# Patient Record
Sex: Female | Born: 1995 | Hispanic: No | Marital: Single | Smoking: Never smoker
Health system: Southern US, Community
[De-identification: ages and names within clinical notes are randomized; demographics above are authoritative.]

---
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
## 2017-04-07 IMAGING — DX DG CHEST 2V
2 series · 2 of 2 positions shown · non-contrast
Comparison: None.

CLINICAL DATA: Fall

EXAM:
CHEST  2 VIEW

[chest pa]
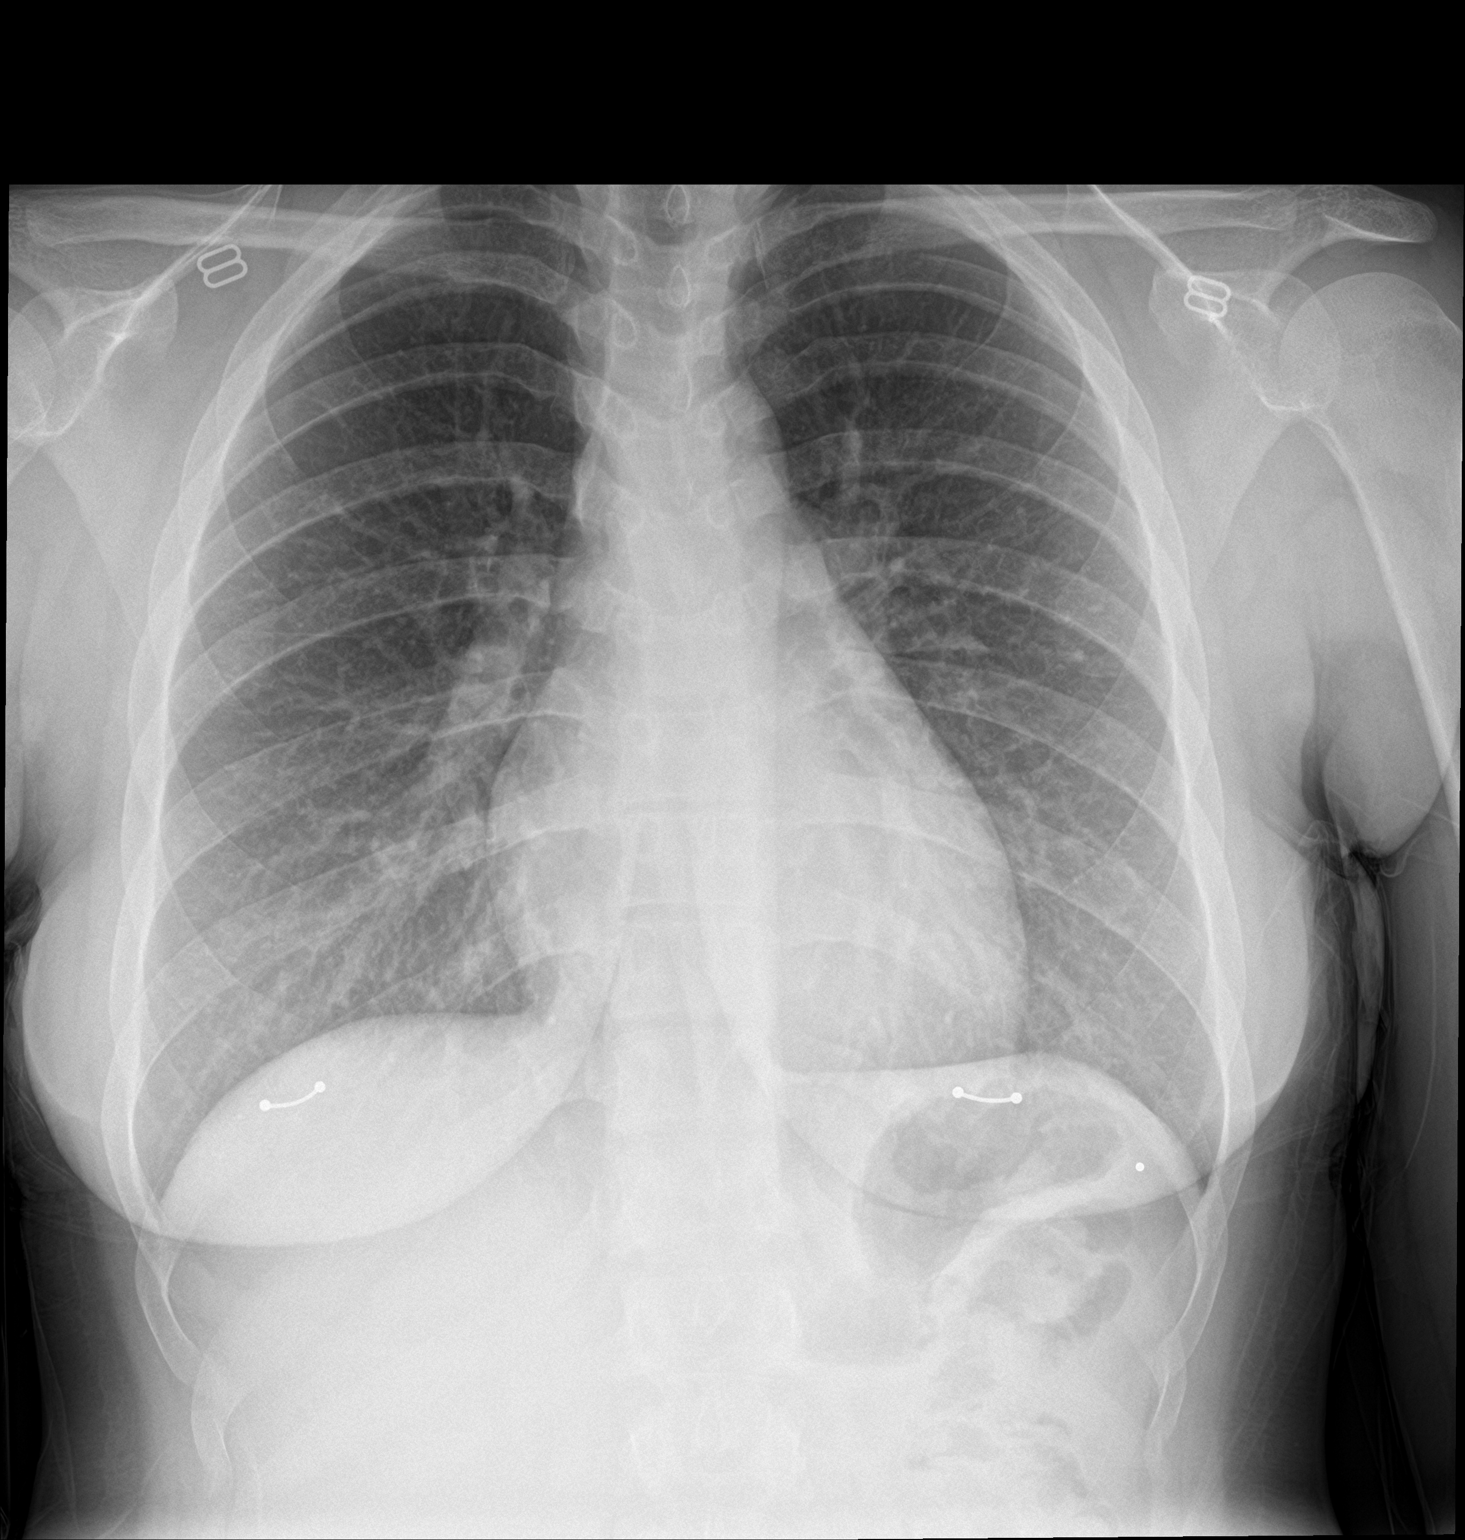

[chest lat]
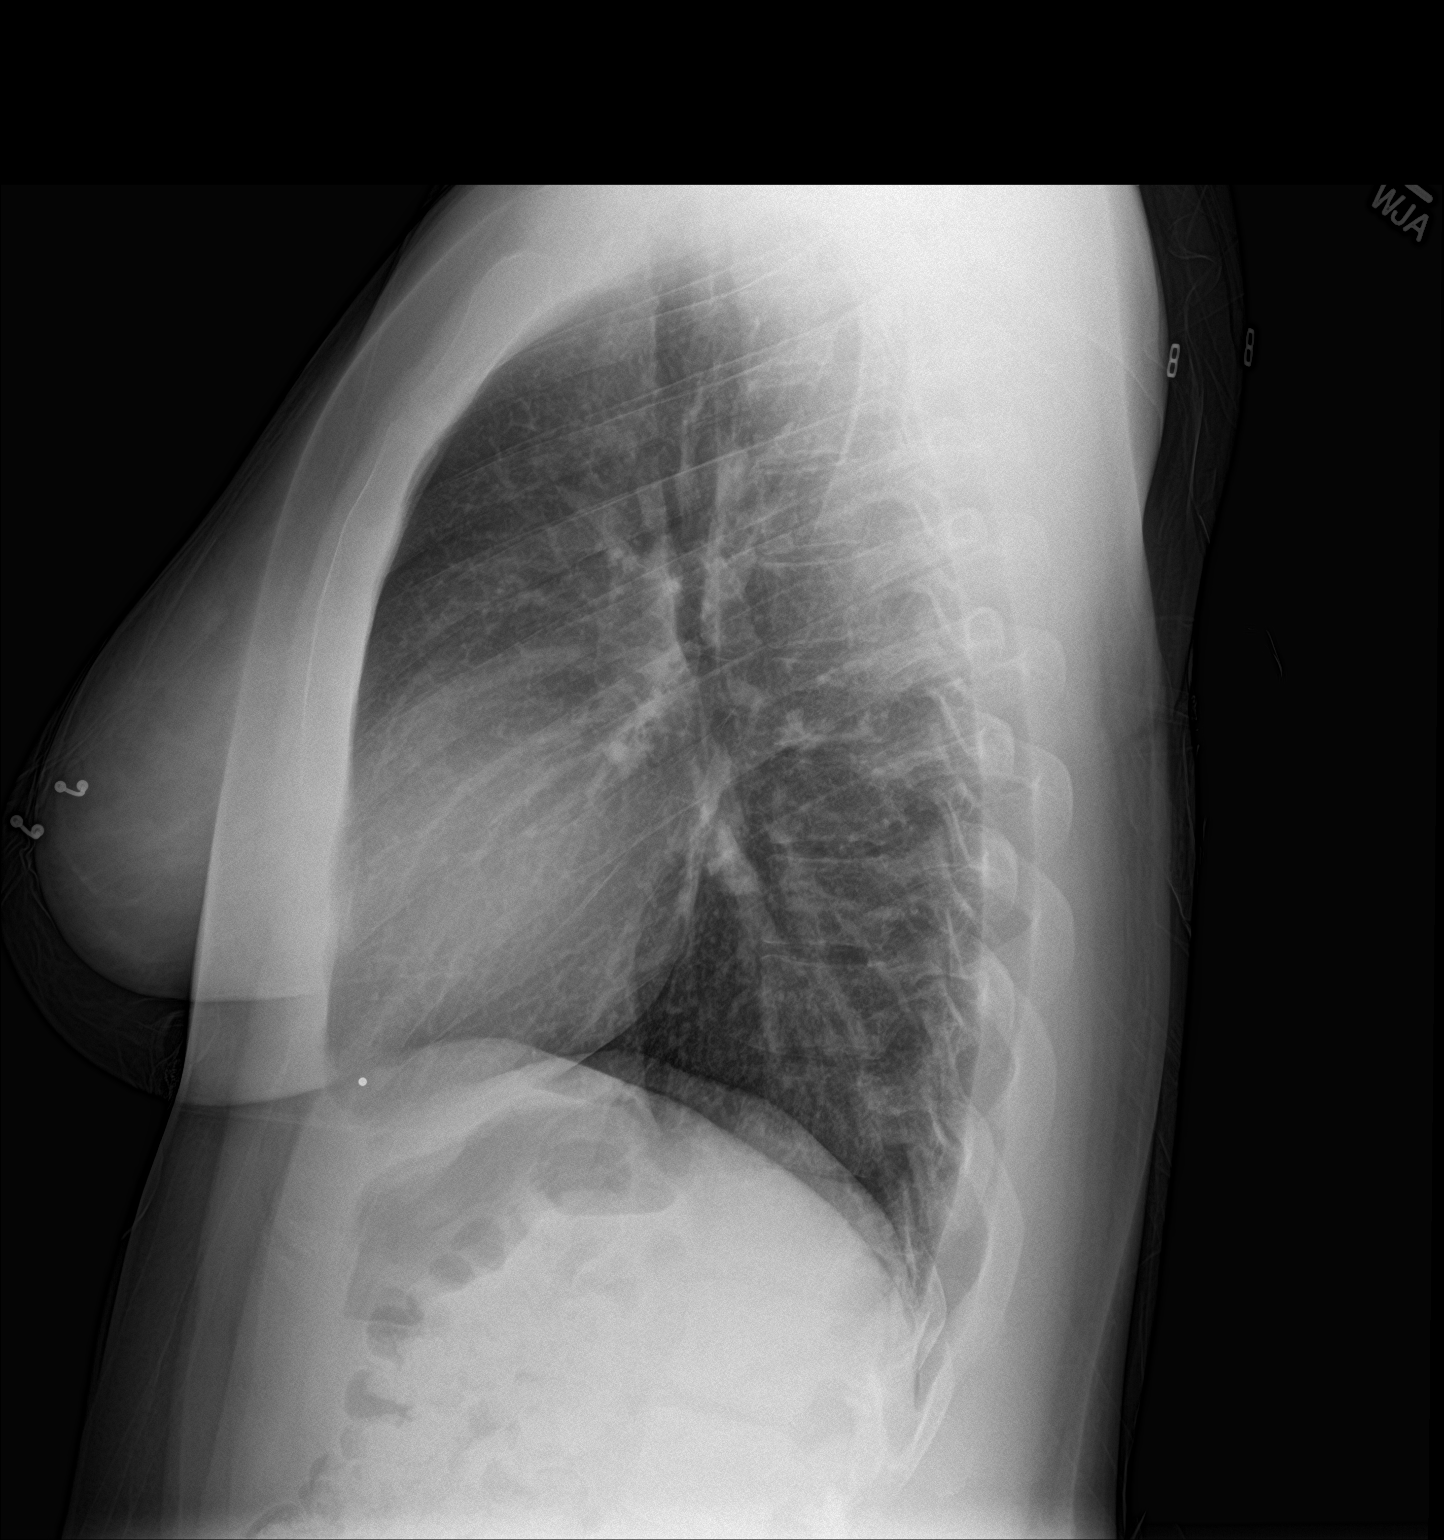

[2 of 2 positions shown; findings below may reference images not displayed]

FINDINGS: The heart size and mediastinal contours are within normal limits.
Both lungs are clear. The visualized skeletal structures are
unremarkable.
IMPRESSION: No active cardiopulmonary disease.
